# Patient Record
Sex: Female | Born: 1960 | Hispanic: Yes | Marital: Single
Health system: Southern US, Community
[De-identification: ages and names within clinical notes are randomized; demographics above are authoritative.]

## PROBLEM LIST (undated history)

## (undated) DIAGNOSIS — R569 Unspecified convulsions: Secondary | ICD-10-CM

## (undated) DIAGNOSIS — F32A Depression, unspecified: Secondary | ICD-10-CM

---

## 2017-11-08 ENCOUNTER — Other Ambulatory Visit (HOSPITAL_COMMUNITY): Payer: Self-pay | Admitting: Neurology

## 2017-11-08 DIAGNOSIS — R569 Unspecified convulsions: Secondary | ICD-10-CM

## 2018-01-10 ENCOUNTER — Encounter (HOSPITAL_COMMUNITY): Payer: Self-pay

## 2018-01-10 ENCOUNTER — Ambulatory Visit (HOSPITAL_COMMUNITY)
Admission: RE | Admit: 2018-01-10 | Discharge: 2018-01-10 | Disposition: A | Discharge status: Home / Self Care | Payer: Medicare Other | Source: Ambulatory Visit | Attending: Neurology | Admitting: Neurology

## 2018-01-10 DIAGNOSIS — R569 Unspecified convulsions: Secondary | ICD-10-CM | POA: Diagnosis present

## 2018-01-10 MED ORDER — GADOBENATE DIMEGLUMINE 529 MG/ML IV SOLN
10.0000 mL | Freq: Once | INTRAVENOUS | Status: AC | PRN
  Administered 2018-01-10: 10 mL via INTRAVENOUS

## 2018-01-26 ENCOUNTER — Other Ambulatory Visit (HOSPITAL_COMMUNITY): Payer: Self-pay | Admitting: Family Medicine

## 2018-01-26 DIAGNOSIS — Z1231 Encounter for screening mammogram for malignant neoplasm of breast: Secondary | ICD-10-CM

## 2018-01-30 ENCOUNTER — Encounter: Payer: Self-pay | Admitting: Nurse Practitioner

## 2018-02-07 ENCOUNTER — Ambulatory Visit (HOSPITAL_COMMUNITY)
Admission: RE | Admit: 2018-02-07 | Discharge: 2018-02-07 | Disposition: A | Discharge status: Home / Self Care | Payer: Medicare Other | Source: Ambulatory Visit | Attending: Family Medicine | Admitting: Family Medicine

## 2018-02-07 ENCOUNTER — Encounter (HOSPITAL_COMMUNITY): Payer: Self-pay

## 2018-02-07 DIAGNOSIS — Z1231 Encounter for screening mammogram for malignant neoplasm of breast: Secondary | ICD-10-CM | POA: Diagnosis present

## 2018-02-08 ENCOUNTER — Ambulatory Visit (HOSPITAL_COMMUNITY): Payer: Medicaid Other

## 2018-05-08 ENCOUNTER — Telehealth: Payer: Self-pay | Admitting: Nurse Practitioner

## 2018-05-08 ENCOUNTER — Ambulatory Visit: Payer: Medicaid Other | Admitting: Nurse Practitioner

## 2018-05-08 ENCOUNTER — Encounter: Payer: Self-pay | Admitting: General Practice

## 2018-05-08 NOTE — Progress Notes (Deleted)
Primary Care Physician:  Kristina Burton, No Pcp Per Primary Gastroenterologist:  Dr.   Bonnetta Barry chief complaint on file.   HPI:   Kristina Burton is a 57 y.o. female who presents to schedule colonoscopy.  Nurse visit was deferred to office visit due to medications.  Reviewed information provided with the referral including ***.  No history of colonoscopy in our system.  Today she states   No past medical history on file.  *** The histories are not reviewed yet. Please review them in the "History" navigator section and refresh this SmartLink.  No current outpatient medications on file.   No current facility-administered medications for this visit.     Allergies as of 05/08/2018  . (Not on File)    Family History  Problem Relation Age of Onset  . Breast cancer Maternal Aunt     Social History   Socioeconomic History  . Marital status: Single    Spouse name: Not on file  . Number of children: Not on file  . Years of education: Not on file  . Highest education level: Not on file  Occupational History  . Not on file  Social Needs  . Financial resource strain: Not on file  . Food insecurity:    Worry: Not on file    Inability: Not on file  . Transportation needs:    Medical: Not on file    Non-medical: Not on file  Tobacco Use  . Smoking status: Not on file  Substance and Sexual Activity  . Alcohol use: Not on file  . Drug use: Not on file  . Sexual activity: Not on file  Lifestyle  . Physical activity:    Days per week: Not on file    Minutes per session: Not on file  . Stress: Not on file  Relationships  . Social connections:    Talks on phone: Not on file    Gets together: Not on file    Attends religious service: Not on file    Active member of club or organization: Not on file    Attends meetings of clubs or organizations: Not on file    Relationship status: Not on file  . Intimate partner violence:    Fear of current or ex partner: Not on file    Emotionally  abused: Not on file    Physically abused: Not on file    Forced sexual activity: Not on file  Other Topics Concern  . Not on file  Social History Narrative  . Not on file    Review of Systems: General: Negative for anorexia, weight loss, fever, chills, fatigue, weakness. Eyes: Negative for vision changes.  ENT: Negative for hoarseness, difficulty swallowing , nasal congestion. CV: Negative for chest pain, angina, palpitations, dyspnea on exertion, peripheral edema.  Respiratory: Negative for dyspnea at rest, dyspnea on exertion, cough, sputum, wheezing.  GI: See history of present illness. GU:  Negative for dysuria, hematuria, urinary incontinence, urinary frequency, nocturnal urination.  MS: Negative for joint pain, low back pain.  Derm: Negative for rash or itching.  Neuro: Negative for weakness, abnormal sensation, seizure, frequent headaches, memory loss, confusion.  Psych: Negative for anxiety, depression, suicidal ideation, hallucinations.  Endo: Negative for unusual weight change.  Heme: Negative for bruising or bleeding. Allergy: Negative for rash or hives.    Physical Exam: There were no vitals taken for this visit. General:   Alert and oriented. Pleasant and cooperative. Well-nourished and well-developed.  Head:  Normocephalic and  atraumatic. Eyes:  Without icterus, sclera clear and conjunctiva pink.  Ears:  Normal auditory acuity. Mouth:  No deformity or lesions, oral mucosa pink.  Throat/Neck:  Supple, without mass or thyromegaly. Cardiovascular:  S1, S2 present without murmurs appreciated. Normal pulses noted. Extremities without clubbing or edema. Respiratory:  Clear to auscultation bilaterally. No wheezes, rales, or rhonchi. No distress.  Gastrointestinal:  +BS, soft, non-tender and non-distended. No HSM noted. No guarding or rebound. No masses appreciated.  Rectal:  Deferred  Musculoskalatal:  Symmetrical without gross deformities. Normal posture. Skin:  Intact  without significant lesions or rashes. Neurologic:  Alert and oriented x4;  grossly normal neurologically. Psych:  Alert and cooperative. Normal mood and affect. Heme/Lymph/Immune: No significant cervical adenopathy. No excessive bruising noted.    05/08/2018 8:02 AM   Disclaimer: This note was dictated with voice recognition software. Similar sounding words can inadvertently be transcribed and may not be corrected upon review.

## 2018-05-08 NOTE — Telephone Encounter (Signed)
PATIENT WAS A NO SHOW AND LETTER SENT  °

## 2018-05-09 ENCOUNTER — Other Ambulatory Visit (HOSPITAL_COMMUNITY): Payer: Self-pay | Admitting: Family Medicine

## 2018-05-09 DIAGNOSIS — M8588 Other specified disorders of bone density and structure, other site: Secondary | ICD-10-CM

## 2018-05-15 ENCOUNTER — Encounter: Payer: Self-pay | Admitting: Internal Medicine

## 2018-05-17 ENCOUNTER — Other Ambulatory Visit (HOSPITAL_COMMUNITY): Payer: Medicaid Other

## 2018-05-21 ENCOUNTER — Encounter (HOSPITAL_COMMUNITY): Payer: Self-pay

## 2018-05-21 ENCOUNTER — Other Ambulatory Visit (HOSPITAL_COMMUNITY): Payer: Medicaid Other

## 2018-06-01 ENCOUNTER — Encounter: Payer: Self-pay | Admitting: Gastroenterology

## 2018-06-01 ENCOUNTER — Ambulatory Visit: Payer: Medicaid Other | Admitting: Gastroenterology

## 2018-07-19 ENCOUNTER — Encounter: Payer: Self-pay | Admitting: Nurse Practitioner

## 2018-09-27 ENCOUNTER — Encounter: Payer: Self-pay | Admitting: Nurse Practitioner

## 2018-09-27 ENCOUNTER — Telehealth: Payer: Self-pay | Admitting: Nurse Practitioner

## 2018-09-27 ENCOUNTER — Ambulatory Visit: Payer: Medicaid Other | Admitting: Nurse Practitioner

## 2018-09-27 ENCOUNTER — Other Ambulatory Visit: Payer: Self-pay

## 2018-09-27 NOTE — Telephone Encounter (Signed)
Patient was a no show and letter sent  °

## 2018-09-27 NOTE — Telephone Encounter (Signed)
Noted  

## 2018-09-27 NOTE — Telephone Encounter (Signed)
3rd time patient was a no show as a new patient

## 2018-11-06 IMAGING — MG DIGITAL SCREENING BILATERAL MAMMOGRAM WITH TOMO AND CAD
3 series · 3 of 7 positions shown · non-contrast
Comparison: Previous exam(s).

CLINICAL DATA: Screening.

EXAM:
DIGITAL SCREENING BILATERAL MAMMOGRAM WITH TOMO AND CAD

[R MLO synth-2D]
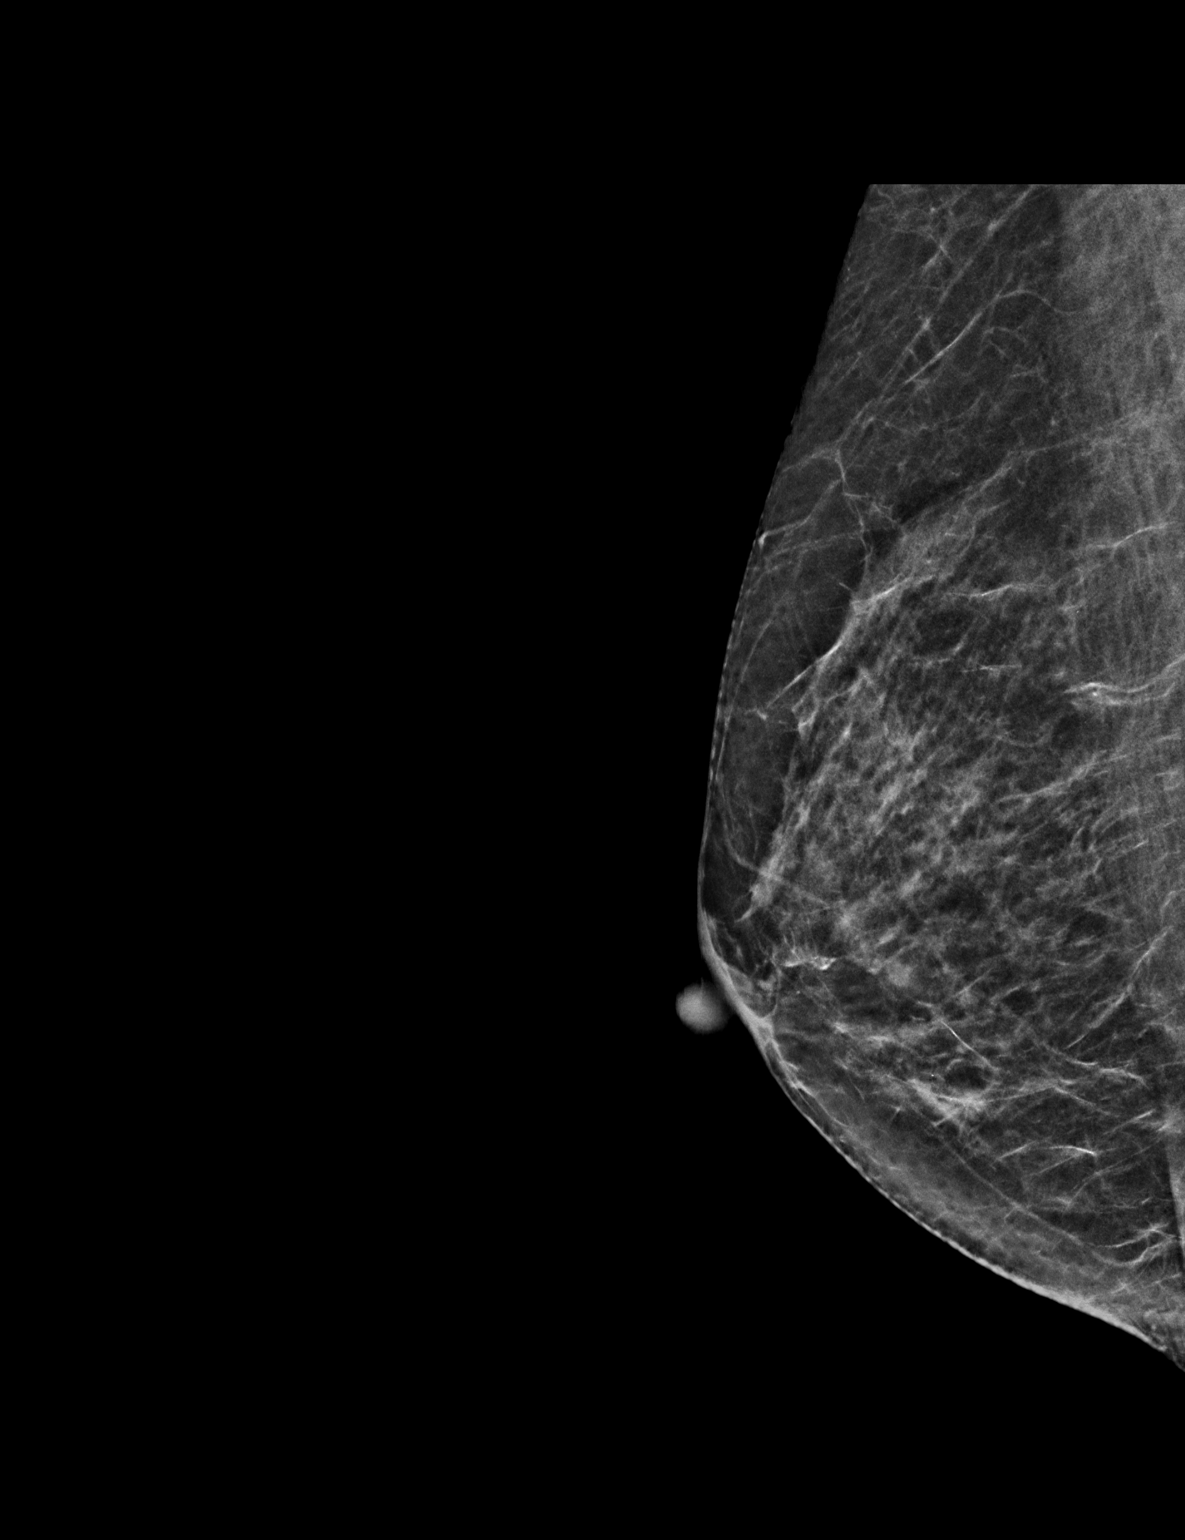

[L MLO synth-2D]
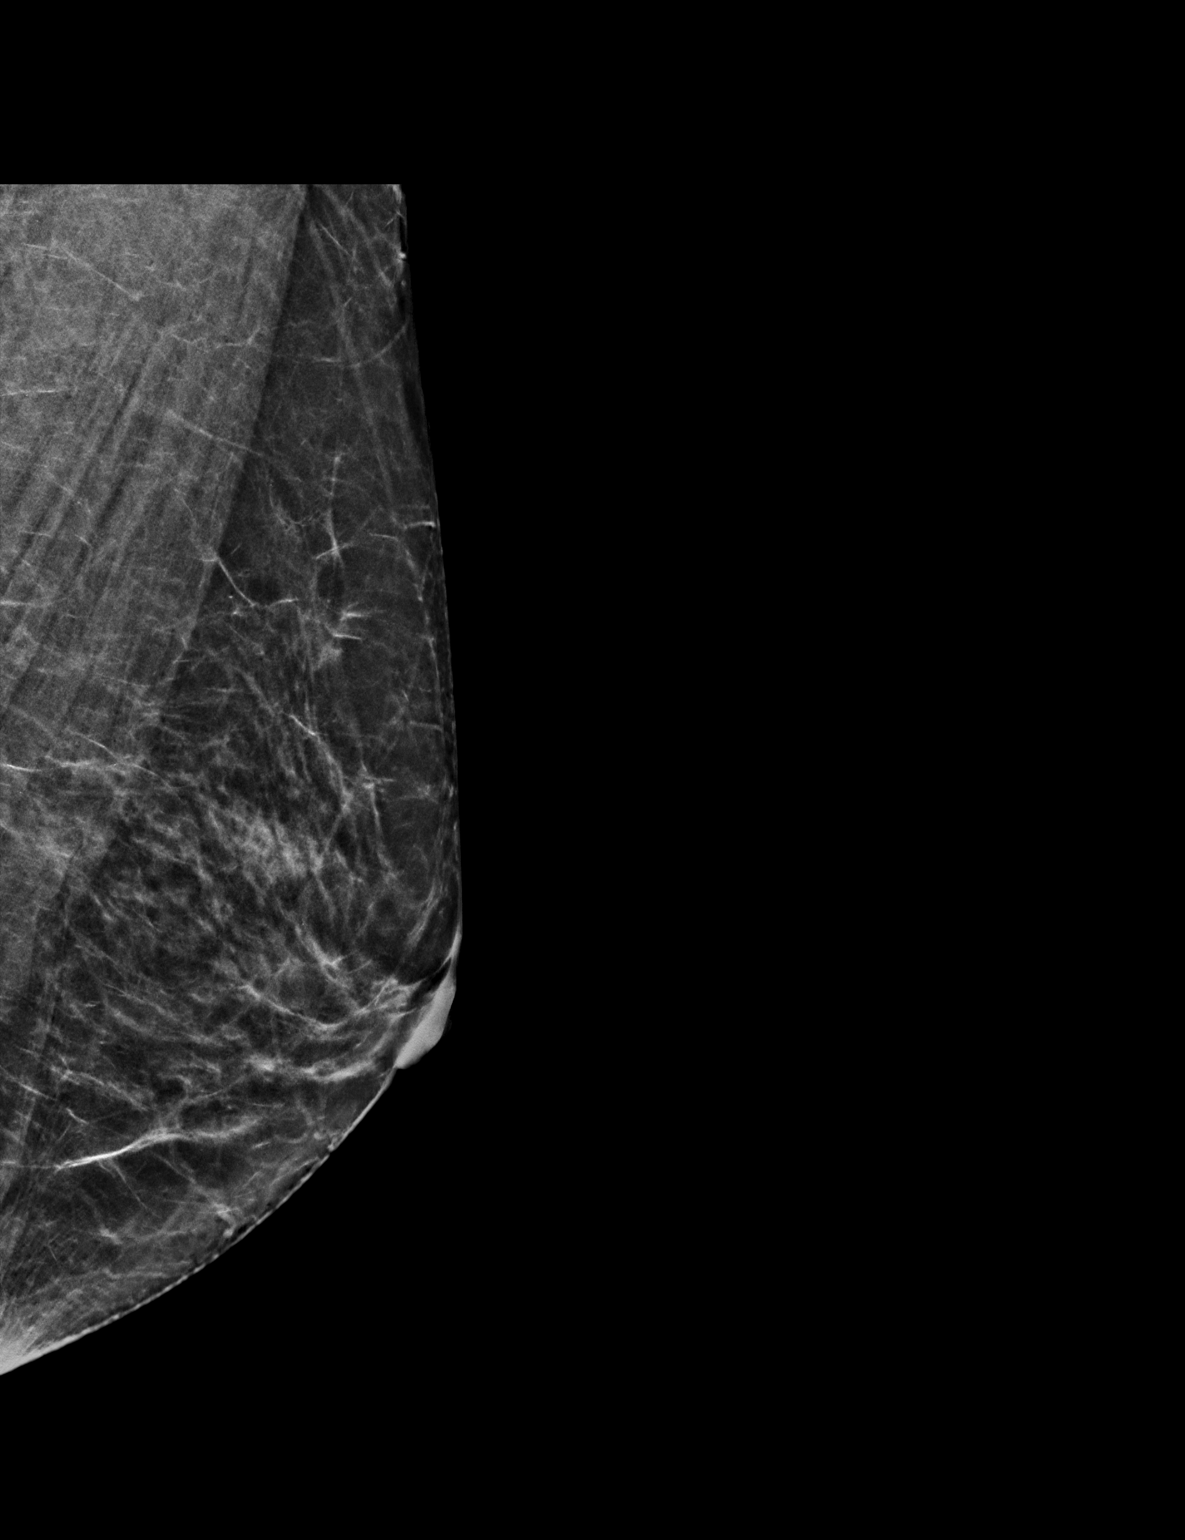

[R CC tomo · tomo slice 23/45.0]
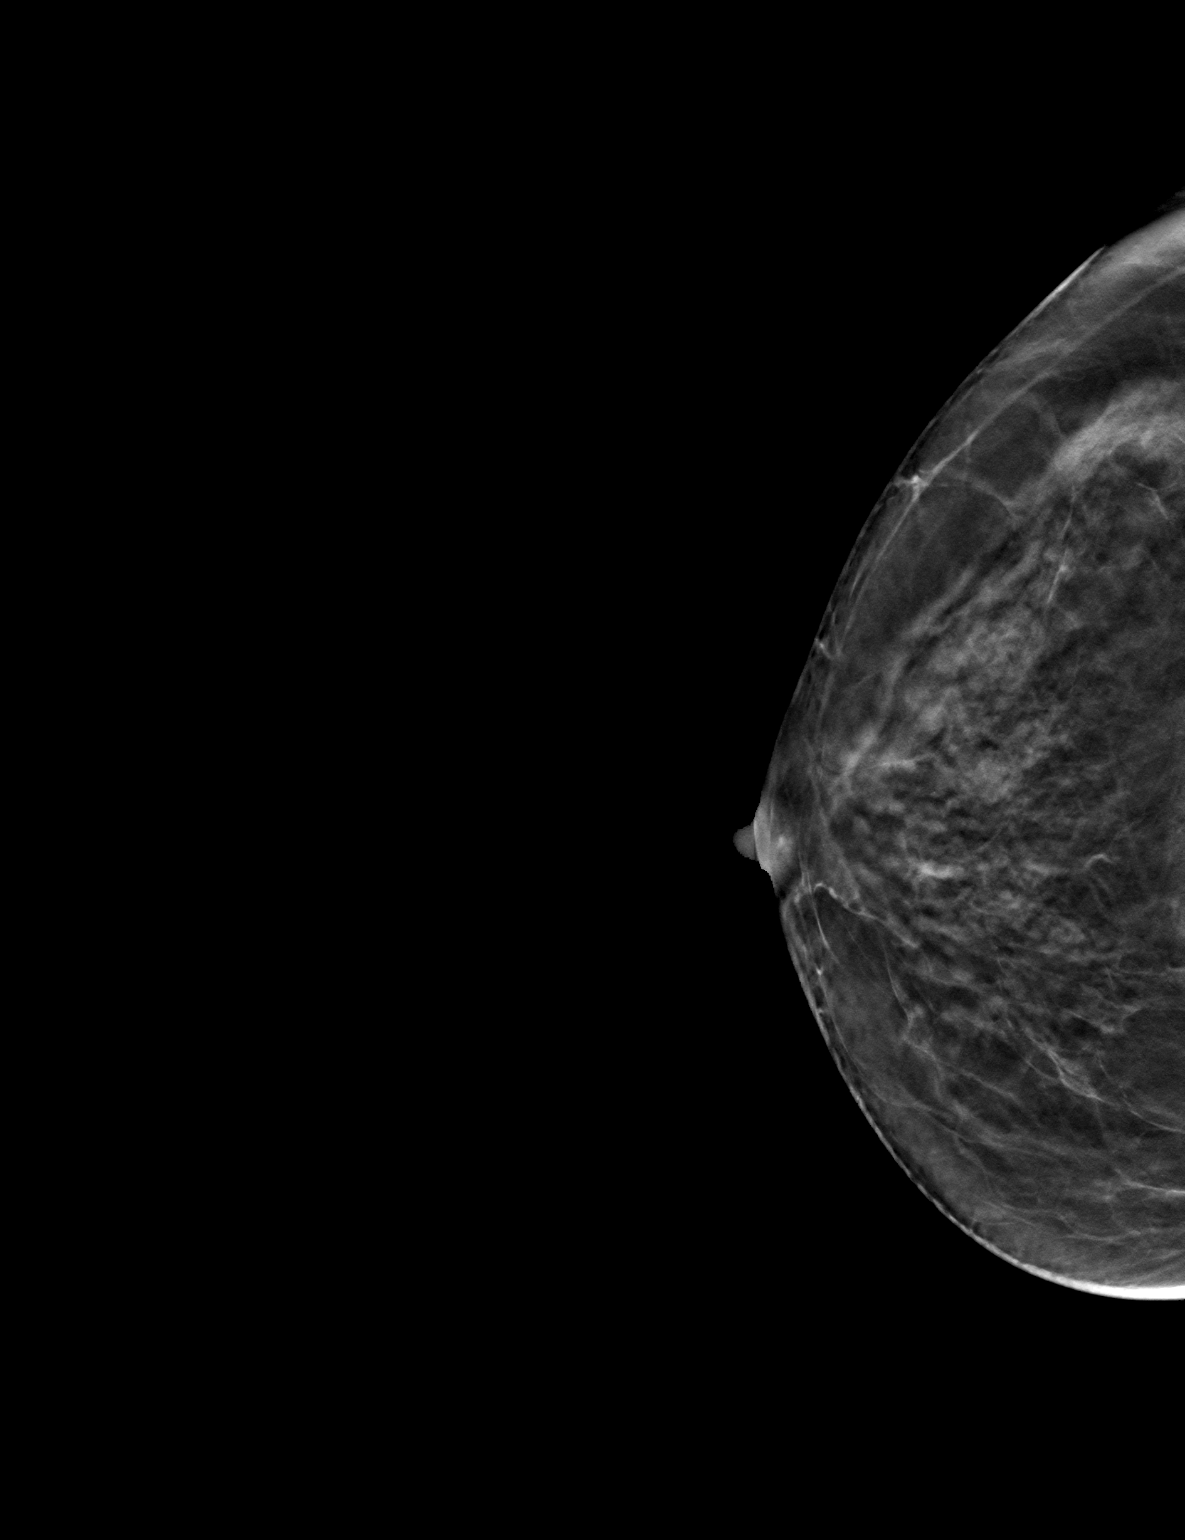

[3 of 7 positions shown; findings below may reference images not displayed]

ACR Breast Density Category b: There are scattered areas of
fibroglandular density.
FINDINGS: There are no findings suspicious for malignancy. Images were
processed with CAD.
IMPRESSION: No mammographic evidence of malignancy. A result letter of this
screening mammogram will be mailed directly to the patient.

RECOMMENDATION:
Screening mammogram in one year. (Code:[id number])

BI-RADS CATEGORY  1: Negative.

## 2021-02-22 ENCOUNTER — Encounter: Payer: Self-pay | Admitting: Internal Medicine

## 2021-05-20 ENCOUNTER — Other Ambulatory Visit (HOSPITAL_COMMUNITY): Payer: Self-pay | Admitting: Family Medicine

## 2021-05-20 ENCOUNTER — Other Ambulatory Visit: Payer: Self-pay | Admitting: Family Medicine

## 2021-05-20 DIAGNOSIS — Z720 Tobacco use: Secondary | ICD-10-CM

## 2021-07-13 ENCOUNTER — Ambulatory Visit: Payer: Medicare Other | Admitting: Gastroenterology

## 2021-07-13 NOTE — Progress Notes (Deleted)
Primary Care Physician:  Alfonse Flavors, MD  Primary Gastroenterologist:    No chief complaint on file.   HPI:  Kristina Burton is a 61 y.o. female here   No current outpatient medications on file.   No current facility-administered medications for this visit.    Allergies as of 07/13/2021   (Not on File)    No past medical history on file.  *** The histories are not reviewed yet. Please review them in the "History" navigator section and refresh this [place].  Family History  Problem Relation Age of Onset   Breast cancer Maternal Aunt     Social History   Socioeconomic History   Marital status: Single    Spouse name: Not on file   Number of children: Not on file   Years of education: Not on file   Highest education level: Not on file  Occupational History   Not on file  Tobacco Use   Smoking status: Not on file   Smokeless tobacco: Not on file  Substance and Sexual Activity   Alcohol use: Not on file   Drug use: Not on file   Sexual activity: Not on file  Other Topics Concern   Not on file  Social History Narrative   Not on file   Social Determinants of Health   Financial Resource Strain: Not on file  Food Insecurity: Not on file  Transportation Needs: Not on file  Physical Activity: Not on file  Stress: Not on file  Social Connections: Not on file  Intimate Partner Violence: Not on file      ROS:  General: Negative for anorexia, weight loss, fever, chills, fatigue, weakness. Eyes: Negative for vision changes.  ENT: Negative for hoarseness, difficulty swallowing , nasal congestion. CV: Negative for chest pain, angina, palpitations, dyspnea on exertion, peripheral edema.  Respiratory: Negative for dyspnea at rest, dyspnea on exertion, cough, sputum, wheezing.  GI: See history of present illness. GU:  Negative for dysuria, hematuria, urinary incontinence, urinary frequency, nocturnal urination.  MS: Negative for joint pain, low back pain.   Derm: Negative for rash or itching.  Neuro: Negative for weakness, abnormal sensation, seizure, frequent headaches, memory loss, confusion.  Psych: Negative for anxiety, depression, suicidal ideation, hallucinations.  Endo: Negative for unusual weight change.  Heme: Negative for bruising or bleeding. Allergy: Negative for rash or hives.    Physical Examination:  There were no vitals taken for this visit.   General: Well-nourished, well-developed in no acute distress.  Head: Normocephalic, atraumatic.   Eyes: Conjunctiva pink, no icterus. Mouth: Oropharyngeal mucosa moist and pink , no lesions erythema or exudate. Neck: Supple without thyromegaly, masses, or lymphadenopathy.  Lungs: Clear to auscultation bilaterally.  Heart: Regular rate and rhythm, no murmurs rubs or gallops.  Abdomen: Bowel sounds are normal, nontender, nondistended, no hepatosplenomegaly or masses, no abdominal bruits or    hernia , no rebound or guarding.   Rectal: not performed Extremities: No lower extremity edema. No clubbing or deformities.  Neuro: Alert and oriented x 4 , grossly normal neurologically.  Skin: Warm and dry, no rash or jaundice.   Psych: Alert and cooperative, normal mood and affect.  Labs: April 2022: A1c 5.3, glucose 101, BUN 16, creatinine 0.64, sodium 141, total bilirubin 0.3, alkaline phosphatase 137, AST 17, ALT 16, albumin 4.6, white blood cell count 8300, hemoglobin 14.6, hematocrit 44, platelets 282,000.,  CRP 0.9, hepatitis C antibody nonreactive, TSH 1.94  Imaging Studies: No results found.   Assessment:  Plan:

## 2021-07-20 ENCOUNTER — Encounter (INDEPENDENT_AMBULATORY_CARE_PROVIDER_SITE_OTHER): Payer: Self-pay | Admitting: *Deleted

## 2021-09-30 ENCOUNTER — Other Ambulatory Visit (HOSPITAL_COMMUNITY): Payer: Self-pay | Admitting: Family Medicine

## 2021-09-30 ENCOUNTER — Other Ambulatory Visit: Payer: Self-pay | Admitting: Family Medicine

## 2021-09-30 DIAGNOSIS — Z1231 Encounter for screening mammogram for malignant neoplasm of breast: Secondary | ICD-10-CM

## 2021-09-30 DIAGNOSIS — Z720 Tobacco use: Secondary | ICD-10-CM

## 2021-10-04 ENCOUNTER — Encounter (INDEPENDENT_AMBULATORY_CARE_PROVIDER_SITE_OTHER): Payer: Self-pay | Admitting: Gastroenterology

## 2021-10-04 ENCOUNTER — Encounter (INDEPENDENT_AMBULATORY_CARE_PROVIDER_SITE_OTHER): Payer: Self-pay | Admitting: *Deleted

## 2021-10-04 ENCOUNTER — Ambulatory Visit (INDEPENDENT_AMBULATORY_CARE_PROVIDER_SITE_OTHER): Payer: Medicare Other | Admitting: Gastroenterology

## 2021-11-10 ENCOUNTER — Ambulatory Visit (HOSPITAL_COMMUNITY): Admission: RE | Admit: 2021-11-10 | Payer: Medicare Other | Source: Ambulatory Visit

## 2021-11-10 ENCOUNTER — Encounter (HOSPITAL_COMMUNITY): Payer: Self-pay

## 2022-04-11 ENCOUNTER — Other Ambulatory Visit (HOSPITAL_COMMUNITY): Payer: Self-pay | Admitting: Family Medicine

## 2022-04-11 DIAGNOSIS — R1031 Right lower quadrant pain: Secondary | ICD-10-CM

## 2022-04-19 ENCOUNTER — Ambulatory Visit (HOSPITAL_COMMUNITY)
Admission: RE | Admit: 2022-04-19 | Discharge: 2022-04-19 | Disposition: A | Discharge status: Home / Self Care | Payer: Medicare Other | Source: Ambulatory Visit | Attending: Family Medicine | Admitting: Family Medicine

## 2022-04-19 DIAGNOSIS — R1031 Right lower quadrant pain: Secondary | ICD-10-CM | POA: Diagnosis present

## 2022-05-03 ENCOUNTER — Other Ambulatory Visit (HOSPITAL_COMMUNITY): Payer: Self-pay | Admitting: Family Medicine

## 2022-05-03 DIAGNOSIS — R1031 Right lower quadrant pain: Secondary | ICD-10-CM

## 2022-05-30 ENCOUNTER — Ambulatory Visit (HOSPITAL_COMMUNITY): Payer: Medicare Other

## 2022-05-30 ENCOUNTER — Ambulatory Visit (HOSPITAL_COMMUNITY): Payer: Medicare Other | Attending: Family Medicine

## 2022-09-09 ENCOUNTER — Emergency Department (HOSPITAL_COMMUNITY)
Admission: EM | Admit: 2022-09-09 | Discharge: 2022-09-09 | Disposition: A | Discharge status: Home / Self Care | Payer: 59 | Attending: Emergency Medicine | Admitting: Emergency Medicine

## 2022-09-09 ENCOUNTER — Encounter (HOSPITAL_COMMUNITY): Payer: Self-pay

## 2022-09-09 ENCOUNTER — Emergency Department (HOSPITAL_COMMUNITY): Payer: 59

## 2022-09-09 ENCOUNTER — Other Ambulatory Visit: Payer: Self-pay

## 2022-09-09 DIAGNOSIS — H5711 Ocular pain, right eye: Secondary | ICD-10-CM | POA: Diagnosis not present

## 2022-09-09 DIAGNOSIS — H5789 Other specified disorders of eye and adnexa: Secondary | ICD-10-CM | POA: Diagnosis not present

## 2022-09-09 DIAGNOSIS — R519 Headache, unspecified: Secondary | ICD-10-CM | POA: Diagnosis not present

## 2022-09-09 HISTORY — DX: Depression, unspecified: F32.A

## 2022-09-09 HISTORY — DX: Unspecified convulsions: R56.9

## 2022-09-09 LAB — CBC WITH DIFFERENTIAL/PLATELET
Abs Immature Granulocytes: 0.02 10*3/uL (ref 0.00–0.07)
Basophils Absolute: 0 10*3/uL (ref 0.0–0.1)
Basophils Relative: 0 %
Eosinophils Absolute: 0.1 10*3/uL (ref 0.0–0.5)
Eosinophils Relative: 1 %
HCT: 41.7 % (ref 36.0–46.0)
Hemoglobin: 14 g/dL (ref 12.0–15.0)
Immature Granulocytes: 0 %
Lymphocytes Relative: 30 %
Lymphs Abs: 2.2 10*3/uL (ref 0.7–4.0)
MCH: 32.8 pg (ref 26.0–34.0)
MCHC: 33.6 g/dL (ref 30.0–36.0)
MCV: 97.7 fL (ref 80.0–100.0)
Monocytes Absolute: 0.5 10*3/uL (ref 0.1–1.0)
Monocytes Relative: 7 %
Neutro Abs: 4.5 10*3/uL (ref 1.7–7.7)
Neutrophils Relative %: 62 %
Platelets: 218 10*3/uL (ref 150–400)
RBC: 4.27 MIL/uL (ref 3.87–5.11)
RDW: 13.1 % (ref 11.5–15.5)
WBC: 7.3 10*3/uL (ref 4.0–10.5)
nRBC: 0 % (ref 0.0–0.2)

## 2022-09-09 LAB — SEDIMENTATION RATE: Sed Rate: 4 mm/hr (ref 0–22)

## 2022-09-09 LAB — BASIC METABOLIC PANEL
Anion gap: 7 (ref 5–15)
BUN: 8 mg/dL (ref 8–23)
CO2: 26 mmol/L (ref 22–32)
Calcium: 8.9 mg/dL (ref 8.9–10.3)
Chloride: 103 mmol/L (ref 98–111)
Creatinine, Ser: 0.6 mg/dL (ref 0.44–1.00)
GFR, Estimated: >60 mL/min (ref >60)
Glucose, Bld: 98 mg/dL (ref 70–99)
Potassium: 3.9 mmol/L (ref 3.5–5.1)
Sodium: 136 mmol/L (ref 135–145)

## 2022-09-09 MED ORDER — DEXAMETHASONE SODIUM PHOSPHATE 10 MG/ML IJ SOLN
10.0000 mg | Freq: Once | INTRAMUSCULAR | Status: AC
  Administered 2022-09-09: 10 mg via INTRAMUSCULAR
  Filled 2022-09-09: qty 1

## 2022-09-09 MED ORDER — ERYTHROMYCIN 5 MG/GM OP OINT
TOPICAL_OINTMENT | Freq: Once | OPHTHALMIC | Status: AC
  Filled 2022-09-09: qty 3.5

## 2022-09-09 MED ORDER — TETRACAINE HCL 0.5 % OP SOLN
2.0000 [drp] | Freq: Once | OPHTHALMIC | Status: AC
  Administered 2022-09-09: 2 [drp] via OPHTHALMIC
  Filled 2022-09-09: qty 4

## 2022-09-09 MED ORDER — FLUORESCEIN SODIUM 1 MG OP STRP
1.0000 | ORAL_STRIP | Freq: Once | OPHTHALMIC | Status: AC
  Administered 2022-09-09: 1 via OPHTHALMIC
  Filled 2022-09-09: qty 1

## 2022-09-09 NOTE — ED Provider Notes (Signed)
[facility] Provider Note   CSN: [id number] Arrival date & time: 09/09/22  1809     History  Chief Complaint  Patient presents with   Eye Problem    Kristina Burton is a 62 y.o. female.   Eye Problem    Patient presents to the emergency department due to right eye pain.  Patient is a very challenging historian, she was offered Optometrist but declined.  She states that that she notices 8 days ago after doing yard work, she rubbed her eye but denies actually getting anything stuck in her eye.  She states that is very painful around the eye, inside the eye and to the right side of her face along the facial nerve distribution.  She is not endorsing any vision changes but states she is supposed to be wearing contacts and glasses at baseline but has not worn them for multiple years.  Difficult to differentiate if there is any change in vision, it is worsened by light.  She does have history of migraines with this feels different, no nausea or vomiting.   Home Medications Prior to Admission medications   Not on File      Allergies    Aspirin    Review of Systems   Review of Systems  Physical Exam Updated Vital Signs BP (!) 112/91   Pulse 100   Temp 97.9 F (36.6 C) (Temporal)   Resp 16   Ht 5\' 1"  (1.549 m)   Wt 49 kg   SpO2 100%   BMI 20.41 kg/m  Physical Exam Vitals and nursing note reviewed. Exam conducted with a chaperone present.  Constitutional:      Appearance: Normal appearance.  HENT:     Head: Normocephalic and atraumatic.  Eyes:     General: No scleral icterus.       Right eye: No discharge.        Left eye: No discharge.     Extraocular Movements: Extraocular movements intact.     Pupils: Pupils are equal, round, and reactive to light.     Comments: EOMI, no teardrop pupil.  Intraocular pressure 18, there is slight abrasion with corneal uptake but there is no dendritic lesions, no Seidel sign.  Cardiovascular:      Rate and Rhythm: Normal rate and regular rhythm.     Pulses: Normal pulses.     Heart sounds: Normal heart sounds.     No friction rub. No gallop.  Pulmonary:     Effort: Pulmonary effort is normal. No respiratory distress.     Breath sounds: Normal breath sounds.  Abdominal:     General: Abdomen is flat. Bowel sounds are normal. There is no distension.     Palpations: Abdomen is soft.     Tenderness: There is no abdominal tenderness.  Skin:    General: Skin is warm and dry.     Coloration: Skin is not jaundiced.  Neurological:     Mental Status: She is alert. Mental status is at baseline.     Coordination: Coordination normal.     Comments: Cranial nerves II through XII gross intact right upper and lower extremity strength is symmetric bilaterally.  Psychiatric:     Comments: Anxious affect     ED Results / Procedures / Treatments   Labs (all labs ordered are listed, but only abnormal results are displayed) Labs Reviewed  BASIC METABOLIC PANEL  CBC WITH DIFFERENTIAL/PLATELET  SEDIMENTATION RATE    EKG  None  Radiology CT Head Wo Contrast  Result Date: 09/09/2022 CLINICAL DATA:  Headache, sudden, severe. EXAM: CT HEAD WITHOUT CONTRAST TECHNIQUE: Contiguous axial images were obtained from the base of the skull through the vertex without intravenous contrast. RADIATION DOSE REDUCTION: This exam was performed according to the departmental dose-optimization program which includes automated exposure control, adjustment of the mA and/or kV according to patient size and/or use of iterative reconstruction technique. COMPARISON:  01/10/2018. FINDINGS: Brain: No acute intracranial hemorrhage, midline shift or mass effect. No extra-axial fluid collection. Gray-white matter differentiation is within normal limits. No hydrocephalus. Vascular: No hyperdense vessel or unexpected calcification. Skull: Normal. Negative for fracture or focal lesion. Sinuses/Orbits: No acute finding. Other: None.  IMPRESSION: No acute intracranial process. Electronically Signed   By: Brett Fairy M.D.   On: 09/09/2022 20:53    Procedures Procedures    Medications Ordered in ED Medications  fluorescein ophthalmic strip 1 strip (1 strip Both Eyes Given 09/09/22 2027)  tetracaine (PONTOCAINE) 0.5 % ophthalmic solution 2 drop (2 drops Both Eyes Given 09/09/22 2027)  dexamethasone (DECADRON) injection 10 mg (10 mg Intramuscular Given 09/09/22 2114)  erythromycin ophthalmic ointment ( Left Eye Given 09/09/22 2114)    ED Course/ Medical Decision Making/ A&P                             Medical Decision Making Amount and/or Complexity of Data Reviewed Labs: ordered. Radiology: ordered.  Risk Prescription drug management.   This is a 61 year old female presenting to the emergency department due to right-sided face pain and right eye pain.  Differential includes corneal abrasion, corneal ulcer, temporal arteritis, SAH, TIA, CVA, atypical migraine, retained foreign body.  Neuroexam is reassuring without any appreciable focal deficits.  I do not really think this is a TIA or CVA.  There is no temporal tenderness, there is no facial nerve palsy.  She does have a new headache given age I think it is reasonable to proceed with a CT head in case of atypical mass or neuroradiology.  Also check labs including an ESR.  Patient would not participate in visual acuity, she does appear to be seeing fine and is following regular with EOMs.  System is intact to confrontation.  There is no obvious retained foreign body, there is slight corneal abrasion without any indication of an ulcer, there is no dendritic lesions, Seidel sign.  Do not think this is a true globe or retained foreign body.  Laboratory workup is reassuring without any abnormalities, no leukocytosis or anemia, BMP without gross electro derangement or AKI, ESR is negative.  CT head is negative for any acute process.  Decadron ordered for the patient, I  also gave erythromycin ointment.  She seems somewhat improved but is still having some symptoms.  I will have her follow-up with ophthalmology given the persistent eye pain.        Final Clinical Impression(s) / ED Diagnoses Final diagnoses:  Pain of right eye    Rx / DC Orders ED Discharge Orders     None         Sherrill Raring, Hershal Coria 09/09/22 2259    Milton Ferguson, MD 09/10/22 1153

## 2022-09-09 NOTE — Discharge Instructions (Addendum)
Use the antibiotic ointment every 8 hours, follow-up with ophthalmology.  Call to schedule an appointment or show up on Monday for evaluation.  Return to the ED for new or concerning symptoms.

## 2022-09-09 NOTE — ED Triage Notes (Signed)
Eye pain for 8 days.  Sensative to light.  No getting better.  Think something got in it from yard work.

## 2022-09-09 NOTE — ED Notes (Signed)
Patient transported to CT 

## 2022-09-15 ENCOUNTER — Ambulatory Visit (HOSPITAL_COMMUNITY): Payer: 59

## 2022-09-23 ENCOUNTER — Ambulatory Visit (HOSPITAL_COMMUNITY): Admission: RE | Admit: 2022-09-23 | Payer: 59 | Source: Ambulatory Visit

## 2022-09-23 ENCOUNTER — Encounter (HOSPITAL_COMMUNITY): Payer: Self-pay | Admitting: Internal Medicine

## 2022-09-23 ENCOUNTER — Other Ambulatory Visit (HOSPITAL_COMMUNITY): Payer: Self-pay | Admitting: Family Medicine

## 2022-09-23 DIAGNOSIS — Z1231 Encounter for screening mammogram for malignant neoplasm of breast: Secondary | ICD-10-CM

## 2022-09-26 ENCOUNTER — Ambulatory Visit (HOSPITAL_COMMUNITY): Admission: RE | Admit: 2022-09-26 | Payer: 59 | Source: Ambulatory Visit

## 2023-01-25 ENCOUNTER — Other Ambulatory Visit (HOSPITAL_COMMUNITY): Payer: Self-pay | Admitting: Family Medicine

## 2023-01-25 DIAGNOSIS — Z720 Tobacco use: Secondary | ICD-10-CM

## 2023-09-08 ENCOUNTER — Other Ambulatory Visit (HOSPITAL_COMMUNITY): Payer: Self-pay | Admitting: Family Medicine

## 2023-09-08 DIAGNOSIS — Z720 Tobacco use: Secondary | ICD-10-CM

## 2023-09-13 ENCOUNTER — Other Ambulatory Visit (HOSPITAL_COMMUNITY): Payer: Self-pay | Admitting: Family Medicine

## 2023-09-13 DIAGNOSIS — Z1231 Encounter for screening mammogram for malignant neoplasm of breast: Secondary | ICD-10-CM

## 2023-10-24 ENCOUNTER — Ambulatory Visit (HOSPITAL_COMMUNITY): Admission: RE | Admit: 2023-10-24 | Source: Ambulatory Visit

## 2023-10-24 ENCOUNTER — Encounter (HOSPITAL_COMMUNITY): Payer: Self-pay
